# Patient Record
Sex: Male | Born: 1975 | Race: Black or African American | Hispanic: No | Smoking: Current every day smoker
Health system: Southern US, Community
[De-identification: ages and names within clinical notes are randomized; demographics above are authoritative.]

## PROBLEM LIST (undated history)

## (undated) DIAGNOSIS — Z789 Other specified health status: Secondary | ICD-10-CM

## (undated) HISTORY — PX: EYE SURGERY: SHX253

---

## 2007-11-25 ENCOUNTER — Ambulatory Visit (HOSPITAL_COMMUNITY): Admission: EM | Admit: 2007-11-25 | Discharge: 2007-11-26 | Payer: Self-pay | Admitting: Emergency Medicine

## 2009-02-01 IMAGING — CT CT ORBIT/TEMPORAL/IAC W/O CM
3 series · 16 of 47 positions shown, 19 images · IV contrast (agent unspecified)
Comparison: None.

CLINICAL DATA: 31-year-old male with trauma to the right eye and swelling.  Possible glass in the eye.  
 CT OF THE ORBITS WITHOUT CONTRAST:
TECHNIQUE: Axial and coronal plane CT imaging was performed through the orbits.  No intravenous contrast was administered.

[Series 4: orbit/facial 2.0 h30s st · axial · 0.33mm/px · z∈[-132,-46]mm · 10 of 51 slices shown, 13 images]
[im 4/51  brain]
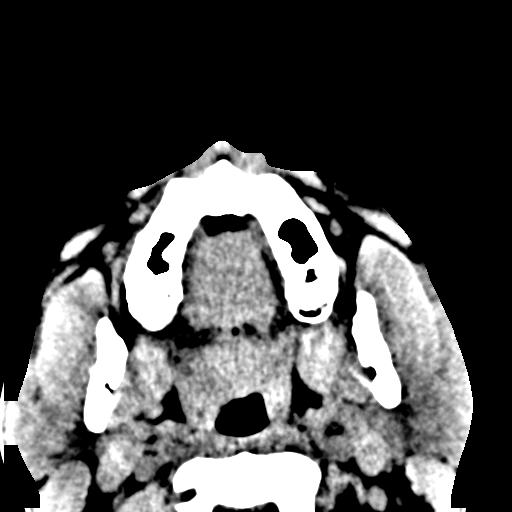
[im 4/51  bone]
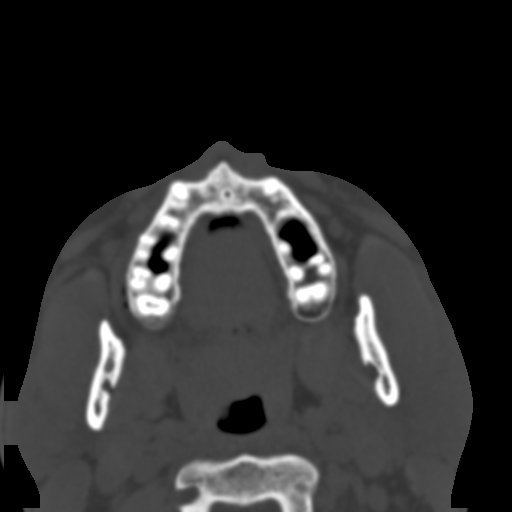
[im 9/51  bone]
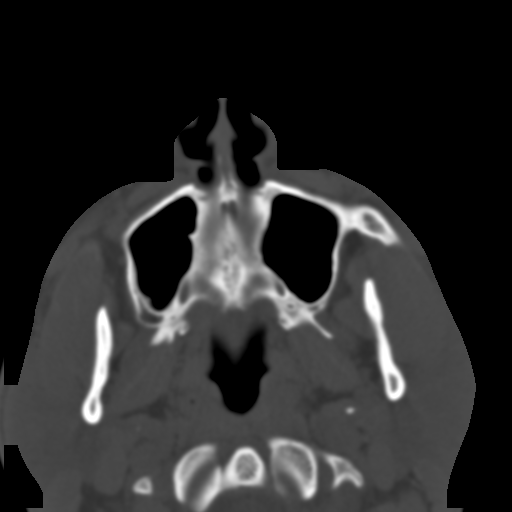
[im 14/51  bone]
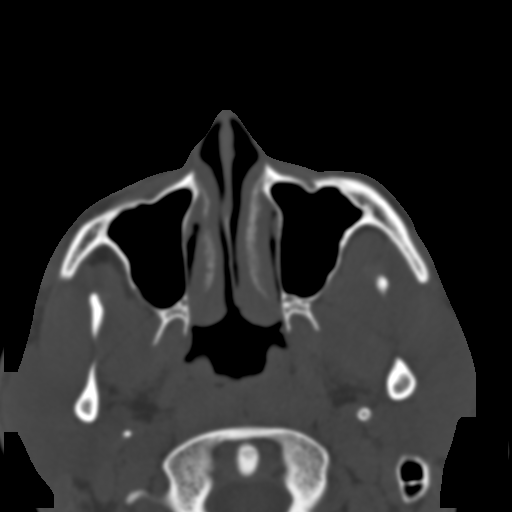
[im 18/51  bone]
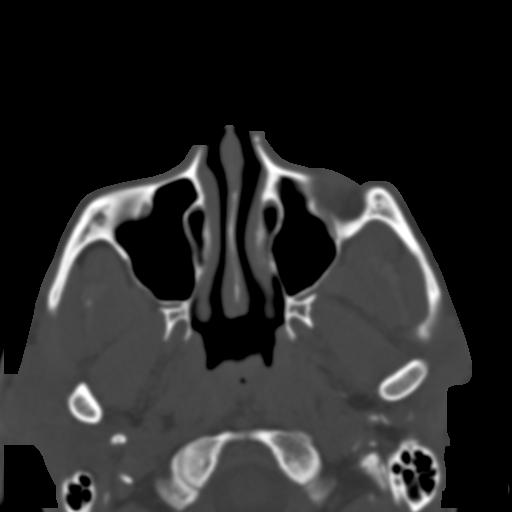
[im 23/51  brain]
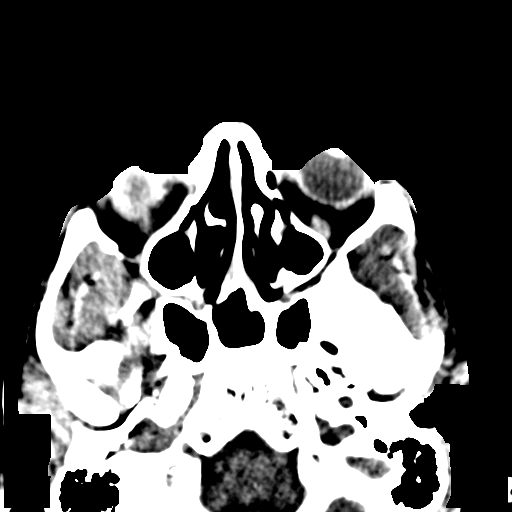
[im 23/51  bone]
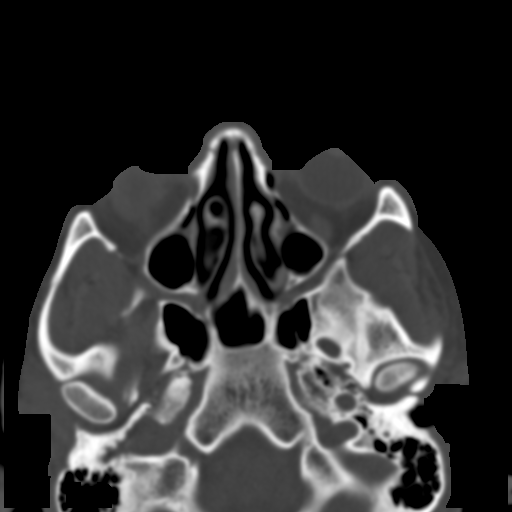
[im 28/51  bone]
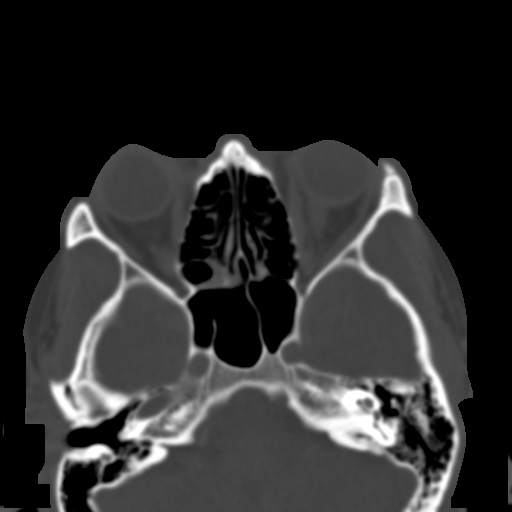
[im 33/51  bone]
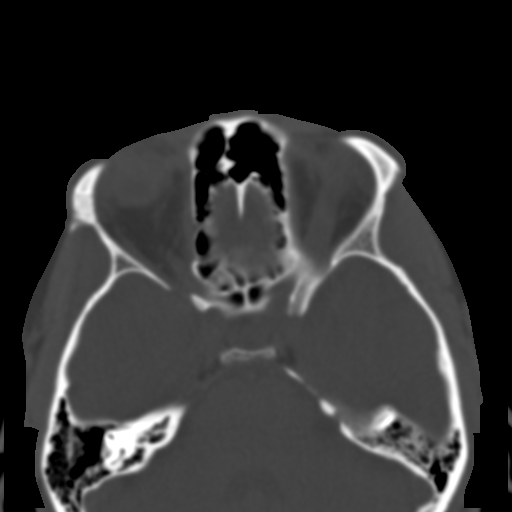
[im 38/51  bone]
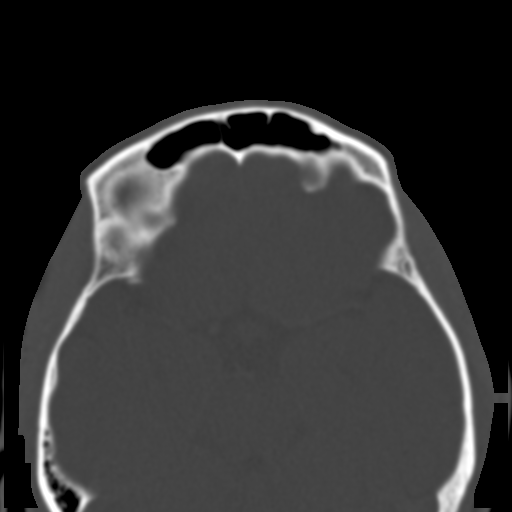
[im 42/51  brain]
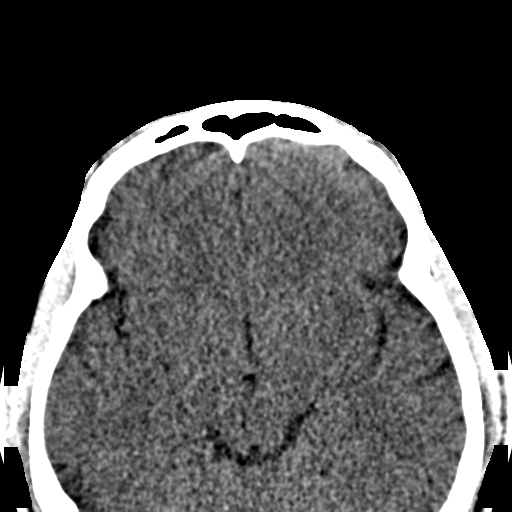
[im 42/51  bone]
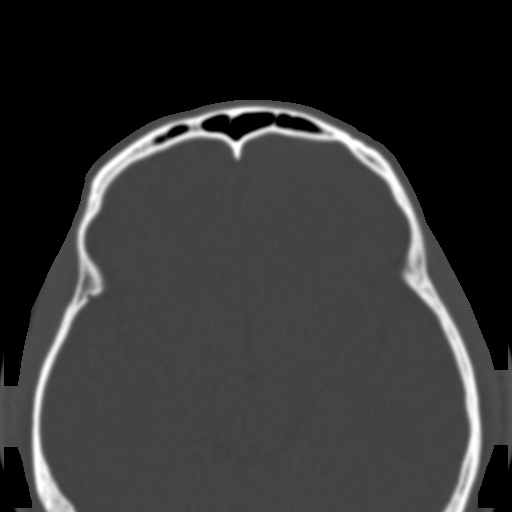
[im 47/51  bone]
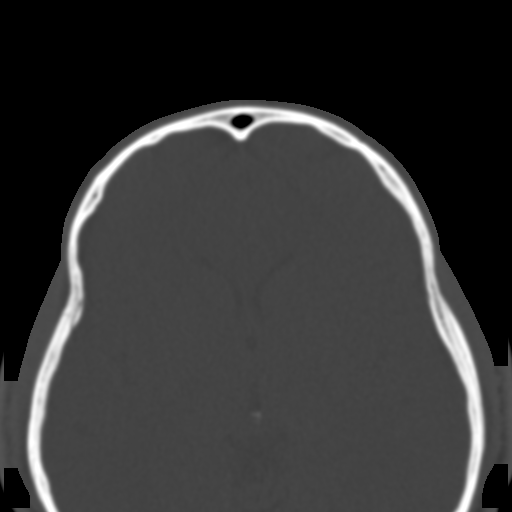

[Series 604: cor · coronal · 0.33mm/px · 3 of 51 slices shown]
[im 17/51  bone]
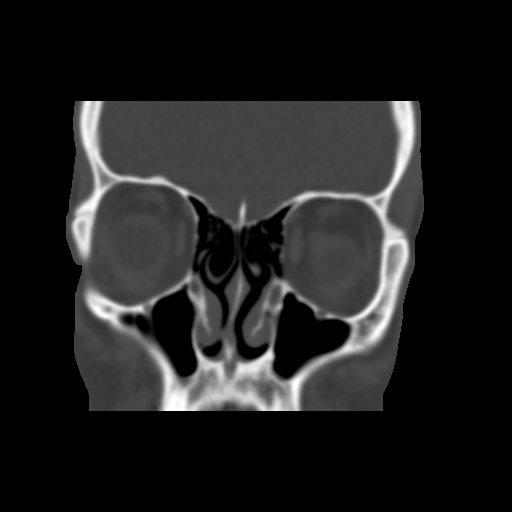
[im 23/51  bone]
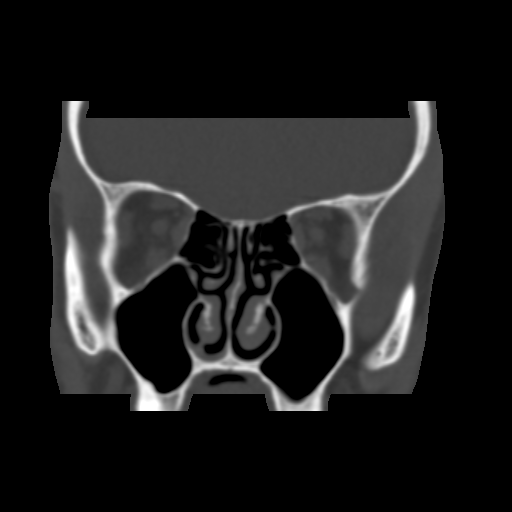
[im 28/51  bone]
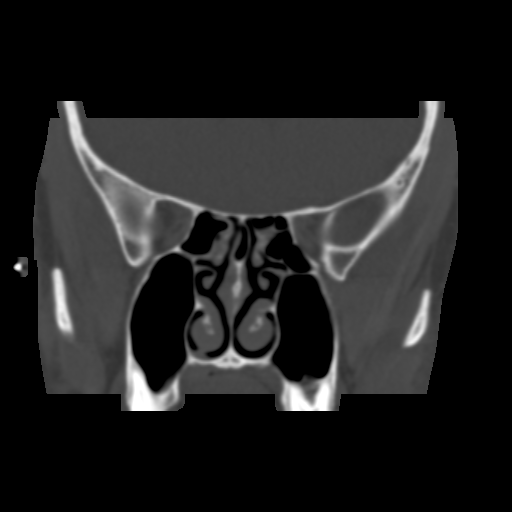

[Series 605: sag · sagittal · 0.33mm/px · 3 of 74 slices shown]
[im 25/74  bone]
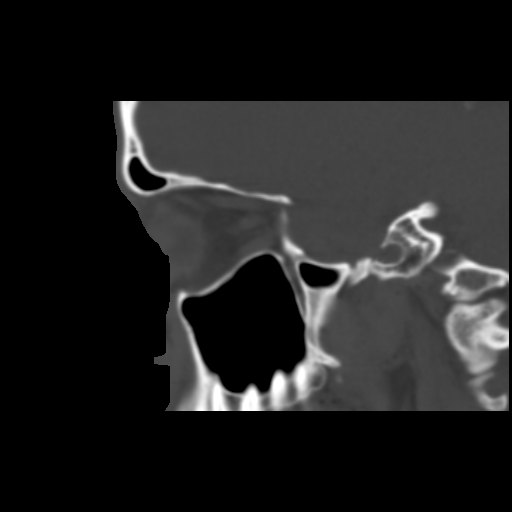
[im 37/74  bone]
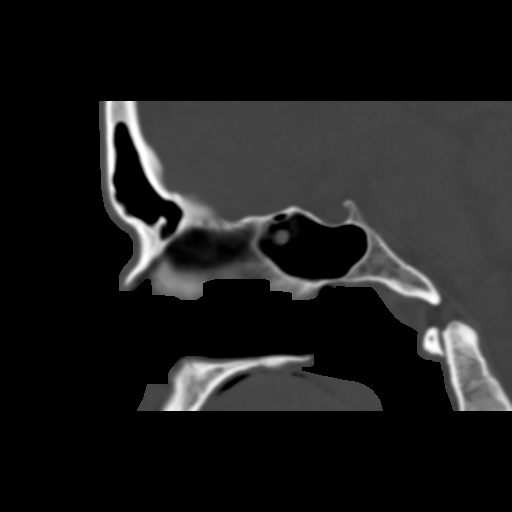
[im 49/74  bone]
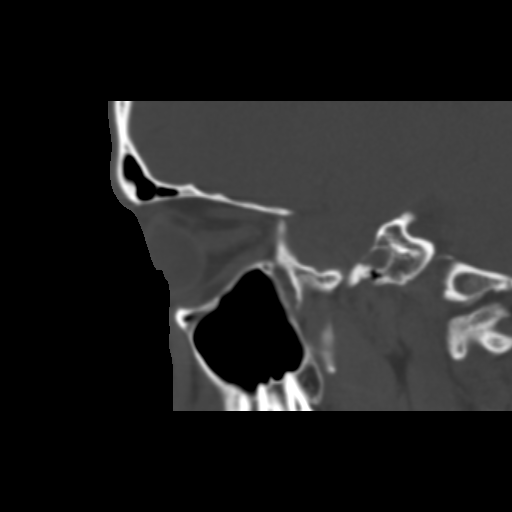

[16 of 47 positions shown; findings below may reference images not displayed]

FINDINGS: There is asymmetric right periorbital soft tissue swelling.  The right globe appears intact.  The right lens is within normal limits.  There is no retrobulbar inflammatory change.  No intraorbital radiopaque foreign body is identified.  Paranasal sinuses are clear aside from mild posterior ethmoid air cell mucosal thickening.  Lamina papyracea are intact. Visualized mastoid air cells are clear.  Bone mineralization is normal.  No nasal bone fracture identified.  No acute orbital fracture is seen.  Visualized brain parenchyma is within normal limits.  The left orbit is normal.
IMPRESSION: 1.  Right periorbital and mild intraorbital soft tissue swelling without retrobulbar inflammation and no radiopaque foreign body identified in the orbit. 
 2.  No acute fracture seen.

## 2011-03-01 NOTE — Op Note (Signed)
[name], [name] NO.:  [id number]   MEDICAL RECORD NO.:  [id number]          PATIENT TYPE:  INP   LOCATION:  2550                         FACILITY:  [facility]   PHYSICIAN:  Michel Harrow, MD DATE OF BIRTH:  1976/09/14   DATE OF PROCEDURE:  DATE OF DISCHARGE:  11/26/2007                               OPERATIVE REPORT   PREOPERATIVE DIAGNOSIS:  Open globe right eye.Marland Kitchen   POSTOPERATIVE DIAGNOSIS:  Open globe right eye.   PROCEDURE:  1. Excision of prolapsed iris tissue right eye.  2. Closure of corneal laceration right eye.  3. Closure of scleral laceration right eye.   SURGEON:  Michel Harrow, MD   SCRUB NURSE ASSISTING:  May Green   ANESTHESIA:  General.   ESTIMATED BLOOD LOSS:  0.   COMPLICATIONS:  None.   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   DESCRIPTION OF PROCEDURE:  The patient was evaluated in the emergency  room.  He was noted to have an open globe requiring surgical repair.  Informed consent was discussed with the patient.  Specifically, the  risks of severe vision loss and infection were discussed.  The patient  was aware that his prognosis for visual recovery is low.  The patient  received 1 g of cefazolin IV and 200 mg of ciprofloxacin IV prior to the  procedure.   The patient was transferred to the operating room.  His eye was rinsed  out with 5% povidone iodine.  Care was taken during the prep not to put  any pressure on the ruptured globe.  The eye was prepped and draped in  the usual sterile opthalmic fashion.  An operating microscope was swung  into position.   The eye was examined under the operating microscope.  A large amount of  iris prolapse was noted through the cornea.  This prolapsed iris was  excised with micro Wescott scissors and 0.124 forceps.  After the iris  was removed, the corneal laceration was seen to extend from the mid  cornea to the inferior sclera.  The  inferior conjunctiva overlying the  sclera was peeled back via peritomy to reveal the underlying scleral  laceration.  A 1 mm blade was used to create a paracentesis.  Vicoat was  injected through the paracentesis to reform the anterior chamber.  A  corneal laceration was closed with 8 interrupted sutures of 10-0 nylon.  The limbal wound was closed with four interrupted sutures of 9-0 nylon.  The scleral laceration was closed with three interrupted sutures of 8-0  nylon.  The knots of the sutures in the cornea and the limbus were  buried.  The conjunctiva was closed over the scleral laceration with two  interrupted Vicryl sutures.  A fluorescein strip was used to check the  wounds for water tightness.  The eye was not leaking.  The anterior  chamber was formed.   Additional findings during the surgery include a traumatic cataract, an  inferior hyphema, and an abnormal pupillary sphincter.   Maxitrol ophthalmic ointment was placed into  the patient's right eye.  This was followed by sterile eye patch and a Fox shield.   The patient was transferred to the recovery room.  In the recovery room,  he received a tetanus prophylaxis shot.  He was instructed to keep the  sterile patch and shield on his eye until he follows up with Dr. Charlotte Sanes  today at 11:00 a.m.  he was given a map to Dr. Marnee Spring office.  He was  given Dr. Marnee Spring phone number.  It was stressed with the patient the  importance of follow up.  He understands that failure to do so will  result in vision loss.  He understands that he will need subsequent  surgeries for possible retinal repair and cataract extraction.  It was  explained to him that at Dr. Marnee Spring office, he would receive a  prescription for ciprofloxacin p.o. and for opthalmic eye drops.      Michel Harrow, MD  Electronically Signed     CLM/MEDQ  D:  11/26/2007  T:  11/26/2007  Job:  [id number]

## 2014-04-24 ENCOUNTER — Encounter (HOSPITAL_COMMUNITY): Payer: Self-pay

## 2014-04-24 ENCOUNTER — Emergency Department (HOSPITAL_COMMUNITY)
Admission: EM | Admit: 2014-04-24 | Discharge: 2014-04-24 | Disposition: A | Discharge status: Home / Self Care | Payer: Self-pay | Attending: Emergency Medicine | Admitting: Emergency Medicine

## 2014-04-24 ENCOUNTER — Encounter (HOSPITAL_COMMUNITY)
Admission: RE | Disposition: A | Discharge status: Home / Self Care | Payer: Self-pay | Source: Ambulatory Visit | Attending: Orthopedic Surgery

## 2014-04-24 ENCOUNTER — Encounter (HOSPITAL_COMMUNITY)
Admission: EM | Disposition: A | Discharge status: Home / Self Care | Payer: Self-pay | Source: Home / Self Care | Attending: Emergency Medicine

## 2014-04-24 ENCOUNTER — Encounter (HOSPITAL_COMMUNITY): Payer: Self-pay | Admitting: Anesthesiology

## 2014-04-24 ENCOUNTER — Emergency Department (HOSPITAL_COMMUNITY): Payer: Self-pay

## 2014-04-24 ENCOUNTER — Ambulatory Visit (HOSPITAL_COMMUNITY): Payer: Self-pay | Admitting: Anesthesiology

## 2014-04-24 ENCOUNTER — Ambulatory Visit (HOSPITAL_COMMUNITY)
Admission: RE | Admit: 2014-04-24 | Discharge: 2014-04-24 | Disposition: A | Discharge status: Home / Self Care | Payer: Self-pay | Source: Ambulatory Visit | Attending: Orthopedic Surgery | Admitting: Orthopedic Surgery

## 2014-04-24 ENCOUNTER — Encounter (HOSPITAL_COMMUNITY): Payer: Self-pay | Admitting: Emergency Medicine

## 2014-04-24 DIAGNOSIS — F17200 Nicotine dependence, unspecified, uncomplicated: Secondary | ICD-10-CM | POA: Insufficient documentation

## 2014-04-24 DIAGNOSIS — S63289A Dislocation of proximal interphalangeal joint of unspecified finger, initial encounter: Secondary | ICD-10-CM

## 2014-04-24 DIAGNOSIS — X58XXXA Exposure to other specified factors, initial encounter: Secondary | ICD-10-CM | POA: Insufficient documentation

## 2014-04-24 DIAGNOSIS — W2203XA Walked into furniture, initial encounter: Secondary | ICD-10-CM | POA: Insufficient documentation

## 2014-04-24 DIAGNOSIS — Y929 Unspecified place or not applicable: Secondary | ICD-10-CM | POA: Insufficient documentation

## 2014-04-24 DIAGNOSIS — S63279A Dislocation of unspecified interphalangeal joint of unspecified finger, initial encounter: Secondary | ICD-10-CM | POA: Insufficient documentation

## 2014-04-24 DIAGNOSIS — Y9383 Activity, rough housing and horseplay: Secondary | ICD-10-CM | POA: Insufficient documentation

## 2014-04-24 HISTORY — PX: CLOSED REDUCTION METACARPAL WITH PERCUTANEOUS PINNING: SHX5613

## 2014-04-24 HISTORY — DX: Other specified health status: Z78.9

## 2014-04-24 SURGERY — CLOSED REDUCTION, FRACTURE, METACARPAL BONE, WITH PERCUTANEOUS PINNING
Anesthesia: General | Site: Finger | Laterality: Right

## 2014-04-24 SURGERY — CLOSED REDUCTION, FINGER, WITH PERCUTANEOUS PINNING
Anesthesia: General | Laterality: Right

## 2014-04-24 MED ORDER — OXYCODONE HCL 5 MG PO TABS
ORAL_TABLET | ORAL | Status: AC
  Filled 2014-04-24: qty 1

## 2014-04-24 MED ORDER — MEPERIDINE HCL 25 MG/ML IJ SOLN
6.2500 mg | INTRAMUSCULAR | Status: DC | PRN
Start: 2014-04-24 — End: 2014-04-25

## 2014-04-24 MED ORDER — LACTATED RINGERS IV SOLN
INTRAVENOUS | Status: DC | PRN
  Administered 2014-04-24: 20:00:00 via INTRAVENOUS

## 2014-04-24 MED ORDER — LIDOCAINE HCL (CARDIAC) 20 MG/ML IV SOLN
INTRAVENOUS | Status: DC | PRN
  Administered 2014-04-24: 100 mg via INTRAVENOUS

## 2014-04-24 MED ORDER — LIDOCAINE HCL (CARDIAC) 20 MG/ML IV SOLN
INTRAVENOUS | Status: AC
  Filled 2014-04-24: qty 5

## 2014-04-24 MED ORDER — OXYCODONE HCL 5 MG/5ML PO SOLN: 5.0000 mg | Freq: Once | ORAL | Status: AC | PRN

## 2014-04-24 MED ORDER — OXYCODONE HCL 5 MG PO TABS
5.0000 mg | ORAL_TABLET | Freq: Once | ORAL | Status: AC | PRN
  Administered 2014-04-24: 5 mg via ORAL

## 2014-04-24 MED ORDER — PROPOFOL 10 MG/ML IV BOLUS
INTRAVENOUS | Status: AC
  Filled 2014-04-24: qty 20

## 2014-04-24 MED ORDER — SUCCINYLCHOLINE CHLORIDE 20 MG/ML IJ SOLN
INTRAMUSCULAR | Status: DC | PRN
  Administered 2014-04-24: 140 mg via INTRAVENOUS

## 2014-04-24 MED ORDER — PROMETHAZINE HCL 25 MG/ML IJ SOLN: 6.2500 mg | INTRAMUSCULAR | Status: DC | PRN

## 2014-04-24 MED ORDER — HYDROMORPHONE HCL PF 1 MG/ML IJ SOLN
0.2500 mg | INTRAMUSCULAR | Status: DC | PRN
  Administered 2014-04-24 (×2): 0.5 mg via INTRAVENOUS

## 2014-04-24 MED ORDER — PROPOFOL 10 MG/ML IV BOLUS
INTRAVENOUS | Status: DC | PRN
  Administered 2014-04-24: 200 mg via INTRAVENOUS

## 2014-04-24 MED ORDER — LACTATED RINGERS IV SOLN
INTRAVENOUS | Status: DC
  Administered 2014-04-24: 19:00:00 via INTRAVENOUS

## 2014-04-24 MED ORDER — CEFAZOLIN SODIUM-DEXTROSE 2-3 GM-% IV SOLR: 2.0000 g | INTRAVENOUS | Status: DC

## 2014-04-24 MED ORDER — CHLORHEXIDINE GLUCONATE 4 % EX LIQD
60.0000 mL | Freq: Once | CUTANEOUS | Status: DC
  Filled 2014-04-24: qty 60

## 2014-04-24 MED ORDER — OXYCODONE-ACETAMINOPHEN 5-325 MG PO TABS: 1.0000 | ORAL_TABLET | Freq: Four times a day (QID) | ORAL | Status: AC | PRN

## 2014-04-24 MED ORDER — SUCCINYLCHOLINE CHLORIDE 20 MG/ML IJ SOLN
INTRAMUSCULAR | Status: AC
  Filled 2014-04-24: qty 1

## 2014-04-24 MED ORDER — SUFENTANIL CITRATE 50 MCG/ML IV SOLN
INTRAVENOUS | Status: AC
  Filled 2014-04-24: qty 1

## 2014-04-24 MED ORDER — SODIUM CHLORIDE 0.9 % IJ SOLN
INTRAMUSCULAR | Status: AC
  Filled 2014-04-24: qty 10

## 2014-04-24 MED ORDER — MIDAZOLAM HCL 2 MG/2ML IJ SOLN
INTRAMUSCULAR | Status: AC
  Filled 2014-04-24: qty 2

## 2014-04-24 MED ORDER — HYDROMORPHONE HCL PF 1 MG/ML IJ SOLN
INTRAMUSCULAR | Status: AC
  Filled 2014-04-24: qty 1

## 2014-04-24 MED ORDER — OXYCODONE-ACETAMINOPHEN 5-325 MG PO TABS: 1.0000 | ORAL_TABLET | Freq: Four times a day (QID) | ORAL | Status: DC | PRN

## 2014-04-24 MED ORDER — MIDAZOLAM HCL 2 MG/2ML IJ SOLN: 0.5000 mg | Freq: Once | INTRAMUSCULAR | Status: DC | PRN

## 2014-04-24 MED ORDER — OXYCODONE-ACETAMINOPHEN 5-325 MG PO TABS
1.0000 | ORAL_TABLET | Freq: Once | ORAL | Status: AC
  Administered 2014-04-24: 1 via ORAL
  Filled 2014-04-24: qty 1

## 2014-04-24 SURGICAL SUPPLY — 2 items
BNDG COHESIVE 1X5 TAN STRL LF (GAUZE/BANDAGES/DRESSINGS) ×3 IMPLANT
SPLINT FINGER (SOFTGOODS) ×3 IMPLANT

## 2014-04-24 NOTE — Transfer of Care (Signed)
Immediate Anesthesia Transfer of Care Note  Patient: [name]  Procedure(s) Performed: Procedure(s): CLOSED REDUCTION RIGHT RING METACARPAL (Right)  Patient Location: PACU  Anesthesia Type:General  Level of Consciousness: awake, alert , oriented and patient cooperative  Airway & Oxygen Therapy: Patient Spontanous Breathing and Patient connected to nasal cannula oxygen  Post-op Assessment: Report given to PACU RN, Post -op Vital signs reviewed and stable and Patient moving all extremities X 4  Post vital signs: Reviewed and stable  Complications: No apparent anesthesia complications

## 2014-04-24 NOTE — ED Notes (Signed)
Pt has deformity to rt ring finger that occurred after horse playing. Pt rates pain 10/10 has limited movement in finger.

## 2014-04-24 NOTE — Brief Op Note (Signed)
04/24/2014  4:15 PM  PATIENT:  [name]  38 y.o. male  PRE-OPERATIVE DIAGNOSIS:  Right Ring Finger  POST-OPERATIVE DIAGNOSIS: right ring finger closed ip dislocation  PROCEDURE:  Procedure(s): CLOSED REDUCTION METACARPAL WITH PERCUTANEOUS PINNING (Right)  SURGEON:  Surgeon(s) and Role:    * Sharma CovertFred W Guilford Shannahan, MD - Primary  PHYSICIAN ASSISTANT:   ASSISTANTS: none   ANESTHESIA:   general  EBL:     BLOOD ADMINISTERED:none  DRAINS: none   LOCAL MEDICATIONS USED:none  SPECIMEN:  No Specimen  DISPOSITION OF SPECIMEN:  N/A  COUNTS:  YES  TOURNIQUET:  * No tourniquets in log *  DICTATIO: [id number]: [id number]  PLAN OF CARE: Discharge to home after PACU  PATIENT DISPOSITION:  PACU - hemodynamically stable.   Delay start of Pharmacological VTE agent (>24hrs) due to surgical blood loss or risk of bleeding: not applicable

## 2014-04-24 NOTE — Anesthesia Procedure Notes (Signed)
Procedure Name: Intubation Date/Time: 04/24/2014 8:10 PM Performed by: Claris Che Pre-anesthesia Checklist: Patient identified, Emergency Drugs available, Suction available and Patient being monitored Patient Re-evaluated:Patient Re-evaluated prior to inductionOxygen Delivery Method: Circle system utilized Preoxygenation: Pre-oxygenation with 100% oxygen Intubation Type: Rapid sequence, IV induction and Cricoid Pressure applied Ventilation: Mask ventilation without difficulty Laryngoscope Size: 4 Grade View: Grade I Tube type: Oral Tube size: 8.0 mm Number of attempts: 1 Airway Equipment and Method: LTA kit utilized and Stylet Placement Confirmation: ETT inserted through vocal cords under direct vision,  positive ETCO2 and breath sounds checked- equal and bilateral Secured at: 24 cm Tube secured with: Tape Dental Injury: Teeth and Oropharynx as per pre-operative assessment

## 2014-04-24 NOTE — Discharge Instructions (Signed)
KEEP BANDAGE CLEAN AND DRY °CALL OFFICE FOR F/U APPT [phone] IN 10-12 DAYS °KEEP HAND ELEVATED ABOVE HEART °OK TO APPLY ICE TO OPERATIVE AREA °CONTACT OFFICE IF ANY WORSENING PAIN OR CONCERNS. °

## 2014-04-24 NOTE — ED Notes (Signed)
Went to room; pt not in room per staff sts was going to "get his phone"; unsure if pt will return

## 2014-04-24 NOTE — Discharge Instructions (Signed)
Please do not eat anything after 8 a.m. this morning. Return to the hospital at 4 PM this evening for surgery. He'll likely need to go to the short stay area. Your surgeon is Dr. Melvyn Novasrtmann.

## 2014-04-24 NOTE — ED Notes (Signed)
Patient here with injury to right ring finger. States he was involved in some "horse play", hit finger on door. Finger appears displaced.

## 2014-04-24 NOTE — ED Provider Notes (Signed)
CSN: [id number]     Arrival date & time 04/24/14  [id number] History   First MD Initiated Contact with Patient 04/24/14 [phone]     Chief Complaint  Patient presents with  . Finger Injury     (Consider location/radiation/quality/duration/timing/severity/associated sxs/prior Treatment) Patient is a 38 y.o. male presenting with hand injury. The history is provided by the patient.  Hand Injury Location:  Finger Finger location:  R ring finger Pain details:    Quality:  Aching   Radiates to:  Does not radiate   Severity:  Moderate   Onset quality:  Sudden   Duration:  1 hour   Timing:  Constant   Progression:  Unchanged Chronicity:  New Handedness:  Right-handed Dislocation: no   Foreign body present:  No foreign bodies Prior injury to area:  No Relieved by:  Nothing Worsened by:  Nothing tried Ineffective treatments:  None tried Associated symptoms: no fever and no neck pain     No past medical history on file. No past surgical history on file. No family history on file. History  Substance Use Topics  . Smoking status: Current Every Day Smoker -- 0.50 packs/day    Types: Cigarettes  . Smokeless tobacco: Not on file  . Alcohol Use: No    Review of Systems  Constitutional: Negative for fever.  HENT: Negative for drooling and rhinorrhea.   Eyes: Negative for pain.  Respiratory: Negative for cough and shortness of breath.   Cardiovascular: Negative for chest pain and leg swelling.  Gastrointestinal: Negative for nausea, vomiting, abdominal pain and diarrhea.  Genitourinary: Negative for dysuria and hematuria.  Musculoskeletal: Negative for gait problem and neck pain.  Skin: Negative for color change.  Neurological: Negative for numbness and headaches.  Hematological: Negative for adenopathy.  Psychiatric/Behavioral: Negative for behavioral problems.  All other systems reviewed and are negative.     Allergies  Review of patient's allergies indicates no known  allergies.  Home Medications   Prior to Admission medications   Not on File   BP 149/78  Pulse 96  Temp(Src) 98.2 F (36.8 C) (Oral)  Resp 18  Ht 5\' 10"  (1.778 m)  Wt 225 lb (102.059 kg)  BMI 32.28 kg/m2  SpO2 97% Physical Exam  Nursing note and vitals reviewed. Constitutional: He is oriented to person, place, and time. He appears well-developed and well-nourished.  HENT:  Head: Normocephalic and atraumatic.  Right Ear: External ear normal.  Left Ear: External ear normal.  Nose: Nose normal.  Mouth/Throat: Oropharynx is clear and moist. No oropharyngeal exudate.  Eyes: Conjunctivae and EOM are normal. Pupils are equal, round, and reactive to light.  Neck: Normal range of motion. Neck supple.  Cardiovascular: Normal rate, regular rhythm, normal heart sounds and intact distal pulses.  Exam reveals no gallop and no friction rub.   No murmur heard. Pulmonary/Chest: Effort normal and breath sounds normal. No respiratory distress. He has no wheezes.  Abdominal: Soft. Bowel sounds are normal. He exhibits no distension. There is no tenderness. There is no rebound and no guarding.  Musculoskeletal: Normal range of motion. He exhibits no edema and no tenderness.  Dorsal and ulnar dislocation of the right ring finger at the PIP joint.  Sensation and motor skills of the right hand otherwise intact.  2+ pulses in the bilateral upper extremities.  Neurological: He is alert and oriented to person, place, and time.  Skin: Skin is warm and dry.  Psychiatric: He has a normal mood and affect. His behavior  is normal.    ED Course  Reduction of dislocation Date/Time: 04/24/2014 7:48 PM Performed by: Purvis Sheffield, S Authorized by: Purvis Sheffield, S Consent: Verbal consent obtained. written consent not obtained. Risks and benefits: risks, benefits and alternatives were discussed Consent given by: patient Patient understanding: patient states understanding of the procedure being  performed Required items: required blood products, implants, devices, and special equipment available Patient identity confirmed: verbally with patient, arm band, provided demographic data and hospital-assigned identification number Time out: Immediately prior to procedure a "time out" was called to verify the correct patient, procedure, equipment, support staff and site/side marked as required. Preparation: Patient was prepped and draped in the usual sterile fashion. Local anesthesia used: yes Anesthesia: digital block Local anesthetic: lidocaine 2% without epinephrine Patient sedated: no Patient tolerance: Patient tolerated the procedure well with no immediate complications.   (including critical care time) Labs Review Labs Reviewed - No data to display  Imaging Review Dg Finger Ring Right  04/24/2014   CLINICAL DATA:  Injury to the ring finger with deformity.  EXAM: RIGHT RING FINGER 2+V  COMPARISON:  None.  FINDINGS: There is dorsal and ulnar dislocation and overriding of the middle phalanx with respect the proximal phalanx of the right fourth finger. No fractures are appreciated. Soft tissue swelling.  IMPRESSION: Dislocation of the proximal interphalangeal joint of the right fourth finger.   Electronically Signed   By: Burman Nieves M.D.   On: 04/24/2014 04:17     EKG Interpretation None      MDM   Final diagnoses:  Dislocation of PIP joint of finger, initial encounter    6:46 AM 38 y.o. male who presents with right Ring finger dislocation which occurred prior to arrival when he was trying to open a door. He is otherwise neurovascularly intact. Will place a digital block and attempt reduction.  I was unsuccessful in reducing the dislocation. I discussed the case with Dr. Melvyn Novas who will take the patient to the OR later this evening.  7:49 PM: Will place in finger splint. Pt to be npo at 8AM, he voices understanding of this.  I have discussed the diagnosis/risks/treatment  options with the patient and believe the pt to be eligible for discharge home to follow-up at 4pm at [facility] this evening for surgery. We also discussed returning to the ED immediately if new or worsening sx occur. We discussed the sx which are most concerning (e.g., worsening pain) that necessitate immediate return. Medications administered to the patient during their visit and any new prescriptions provided to the patient are listed below.  Medications given during this visit Medications  oxyCODONE-acetaminophen (PERCOCET/ROXICET) 5-325 MG per tablet 1 tablet (1 tablet Oral Given 04/24/14 0344)    Discharge Medication List as of 04/24/2014  7:34 AM    START taking these medications   Details  oxyCODONE-acetaminophen (PERCOCET) 5-325 MG per tablet Take 1 tablet by mouth every 6 (six) hours as needed., Starting 04/24/2014, Until Discontinued, Print           Junius Argyle, MD 04/24/14 [phone]

## 2014-04-24 NOTE — Progress Notes (Signed)
Orthopedic Tech Progress Note Patient Details:  [name] 1975-12-03 [id number] Finger splint applied Ortho Devices Type of Ortho Device: Finger splint Ortho Device/Splint Location: R r4th digit Ortho Device/Splint Interventions: Application   Asia R Thompson 04/24/2014, 7:45 AM

## 2014-04-24 NOTE — Anesthesia Preprocedure Evaluation (Addendum)
Anesthesia Evaluation  Patient identified by MRN, date of birth, ID band Patient awake    Reviewed: Allergy & Precautions, H&P , NPO status , Patient's Chart, lab work & pertinent test results  History of Anesthesia Complications Negative for: history of anesthetic complications  Airway Mallampati: II TM Distance: >3 FB     Dental  (+) Dental Advisory Given   Pulmonary Current Smoker,  breath sounds clear to auscultation        Cardiovascular negative cardio ROS  Rhythm:Regular Rate:Normal     Neuro/Psych negative neurological ROS     GI/Hepatic negative GI ROS, Neg liver ROS,   Endo/Other  Morbid obesity  Renal/GU negative Renal ROS     Musculoskeletal   Abdominal (+) + obese,   Peds  Hematology negative hematology ROS (+)   Anesthesia Other Findings   Reproductive/Obstetrics                          Anesthesia Physical Anesthesia Plan  ASA: II  Anesthesia Plan: General   Post-op Pain Management:    Induction: Intravenous and Rapid sequence  Airway Management Planned: Oral ETT  Additional Equipment:   Intra-op Plan:   Post-operative Plan: Extubation in OR  Informed Consent: I have reviewed the patients History and Physical, chart, labs and discussed the procedure including the risks, benefits and alternatives for the proposed anesthesia with the patient or authorized representative who has indicated his/her understanding and acceptance.   Dental advisory given  Plan Discussed with: Surgeon and CRNA  Anesthesia Plan Comments: (Plan routine monitors, GETA)        Anesthesia Quick Evaluation

## 2014-04-24 NOTE — Anesthesia Postprocedure Evaluation (Signed)
  Anesthesia Post-op Note  Patient: [name]  Procedure(s) Performed: Procedure(s): CLOSED REDUCTION RIGHT RING METACARPAL (Right)  Patient Location: PACU  Anesthesia Type:General  Level of Consciousness: awake, alert , oriented and patient cooperative  Airway and Oxygen Therapy: Patient Spontanous Breathing  Post-op Pain: none  Post-op Assessment: Post-op Vital signs reviewed, Patient's Cardiovascular Status Stable, Respiratory Function Stable, Patent Airway, No signs of Nausea or vomiting and Pain level controlled  Post-op Vital Signs: Reviewed and stable  Last Vitals:  Filed Vitals:   04/24/14 2100  BP: 125/80  Pulse: 62  Temp:   Resp: 14    Complications: No apparent anesthesia complications

## 2014-04-24 NOTE — H&P (Signed)
[name] is an 38 y.o. male.   Chief Complaint: RIGHT RING FINGER PIP DISLOCATION HPI: PT SUSTAINED CLOSED INJURY TO RIGHT RING FINGER PT PRESENTED TO ED WITH DEFORMITY TO RIGHT RING FINGER SEVERAL ATTEMPTS MADE AT REDUCTION AND UNSUCCESSFUL PT HERE FOR SURGERY ON RIGHT RING FINGER TO REDUCE JOINT  No past medical history on file.  No past surgical history on file.  No family history on file. Social History:  reports that he has been smoking Cigarettes.  He has been smoking about 0.50 packs per day. He does not have any smokeless tobacco history on file. He reports that he does not drink alcohol or use illicit drugs.  Allergies: No Known Allergies  No prescriptions prior to admission    No results found for this or any previous visit (from the past 48 hour(s)). Dg Finger Ring Right  04/24/2014   CLINICAL DATA:  Injury to the ring finger with deformity.  EXAM: RIGHT RING FINGER 2+V  COMPARISON:  None.  FINDINGS: There is dorsal and ulnar dislocation and overriding of the middle phalanx with respect the proximal phalanx of the right fourth finger. No fractures are appreciated. Soft tissue swelling.  IMPRESSION: Dislocation of the proximal interphalangeal joint of the right fourth finger.   Electronically Signed   By: Burman NievesWilliam  Stevens M.D.   On: 04/24/2014 04:17    ROS NO RECENT ILLNESSES OR HOSPITALIZATIONS  There were no vitals taken for this visit. Physical Exam   General Appearance:  Alert, cooperative, no distress, appears stated age  Head:  Normocephalic, without obvious abnormality, atraumatic  Eyes:  Pupils equal, conjunctiva/corneas clear,         Throat: Lips, mucosa, and tongue normal; teeth and gums normal  Neck: No visible masses     Lungs:   respirations unlabored  Chest Wall:  No tenderness or deformity  Heart:  Regular rate and rhythm,  Abdomen:   Soft, non-tender,         Extremities: RIGHT RING FINGER, OBVIOUS DEFORMITY NO INJURIES TO INDEX/LONG AND  SMALL GOOD THUMB MOBILITY FINGERS WARM WELL PERFUSED  Pulses: 2+ and symmetric  Skin: Skin color, texture, turgor normal, no rashes or lesions     Neurologic: Normal    Assessment/Plan RIGHT RING FINGER IRREDUCIBLE PIP DISLOCATION  RIGHT RING FINGER CLOSED MANIPULATION VERSUS OPEN REDUCTION AND RECONSTRUCTION  R/B/A DISCUSSED WITH PT IN HOSPITAL.  PT VOICED UNDERSTANDING OF PLAN CONSENT SIGNED DAY OF SURGERY PT SEEN AND EXAMINED PRIOR TO OPERATIVE PROCEDURE/DAY OF SURGERY SITE MARKED. QUESTIONS ANSWERED WILL GO HOME FOLLOWING SURGERY WE ARE PLANNING SURGERY FOR YOUR UPPER EXTREMITY. THE RISKS AND BENEFITS OF SURGERY INCLUDE BUT NOT LIMITED TO BLEEDING INFECTION, DAMAGE TO NEARBY NERVES ARTERIES TENDONS, FAILURE OF SURGERY TO ACCOMPLISH ITS INTENDED GOALS, PERSISTENT SYMPTOMS AND NEED FOR FURTHER SURGICAL INTERVENTION. WITH THIS IN MIND WE WILL PROCEED. I HAVE DISCUSSED WITH THE PATIENT THE PRE AND POSTOPERATIVE REGIMEN AND THE DOS AND DON'TS. PT VOICED UNDERSTANDING AND INFORMED CONSENT SIGNED. Sharma CovertTMANN,[name] 04/24/2014, 1829 PM

## 2014-04-25 ENCOUNTER — Encounter (HOSPITAL_COMMUNITY): Payer: Self-pay | Admitting: Orthopedic Surgery

## 2014-04-25 NOTE — Op Note (Signed)
NAMGary Fleet:  [place], Ruslan                ACCOUNT NO.:  [id number]  MEDICAL RECORD NO.:  [id number]  LOCATION:                                 FACILITY:  PHYSICIAN:  Madelynn DoneFred W Raidyn Wassink IV, MD  DATE OF BIRTH:  July 09, 1976  DATE OF PROCEDURE:  04/24/2014 DATE OF DISCHARGE:  04/24/2014                              OPERATIVE REPORT   PREOPERATIVE DIAGNOSIS:  Right ring finger, irreducible proximal interphalangeal dislocation.  POSTOPERATIVE DIAGNOSIS:  Right ring finger, irreducible proximal interphalangeal dislocation.  ATTENDING PHYSICIAN:  Sharma CovertFred W. Shefali Ng IV, MD, was scrubbed and present for the entire procedure.  ASSISTANT SURGEON:  None.  ANESTHESIA:  General via endotracheal tube.  SURGICAL PROCEDURE: 1. Closed manipulation of right ring finger IP dislocation requiring     anesthesia. 2. Radiographs 2 views, right ring finger. 3. Radiographic interpretation, AP lateral views of the finger did     show the concentric reduction of the dorsal IP dislocation.  SURGICAL INDICATIONS:  Mr. Derrell Lollingngram is a 38 year old gentleman who underwent closed manipulation, unsuccessfully for an IP dislocation in the ER.  Multiple attempts were made and it was recommended that he undergo the above procedure.  Risks, benefits, and alternatives were discussed with the patient and signed informed consent was obtained.  DESCRIPTION OF PROCEDURE:  The patient was properly identified in the preoperative holding area and marked with a permanent marker made on the right ring finger to indicate the correct operative site.  The patient was brought back to the operating room, placed supine on anesthesia room table where the general anesthetic was administered.  The patient tolerated this well.  Following anesthesia, closed manipulation was then performed.  This reduced very nicely.  A dorsal blocking splint was then applied.  Final radiographs were obtained.  The patient was extubated and taken to recovery room in  good condition.  POSTPROCEDURE PLAN:  The patient discharged home, seen back in the office approximately 10-14 days for x-rays and then continue with a dorsal blocking splint for a total of 3 weeks and then gradually use in activity.  Radiographs at each visit.     Madelynn DoneFred W Zaynab Chipman IV, MD     FWO/MEDQ  D:  04/24/2014  T:  04/25/2014  Job:  [id number]

## 2015-07-02 IMAGING — CR DG FINGER RING 2+V*R*
3 series · 3 of 3 positions shown · non-contrast
Comparison: None.

CLINICAL DATA: Injury to the ring finger with deformity.

EXAM:
RIGHT RING FINGER 2+V

[x finger pa right]
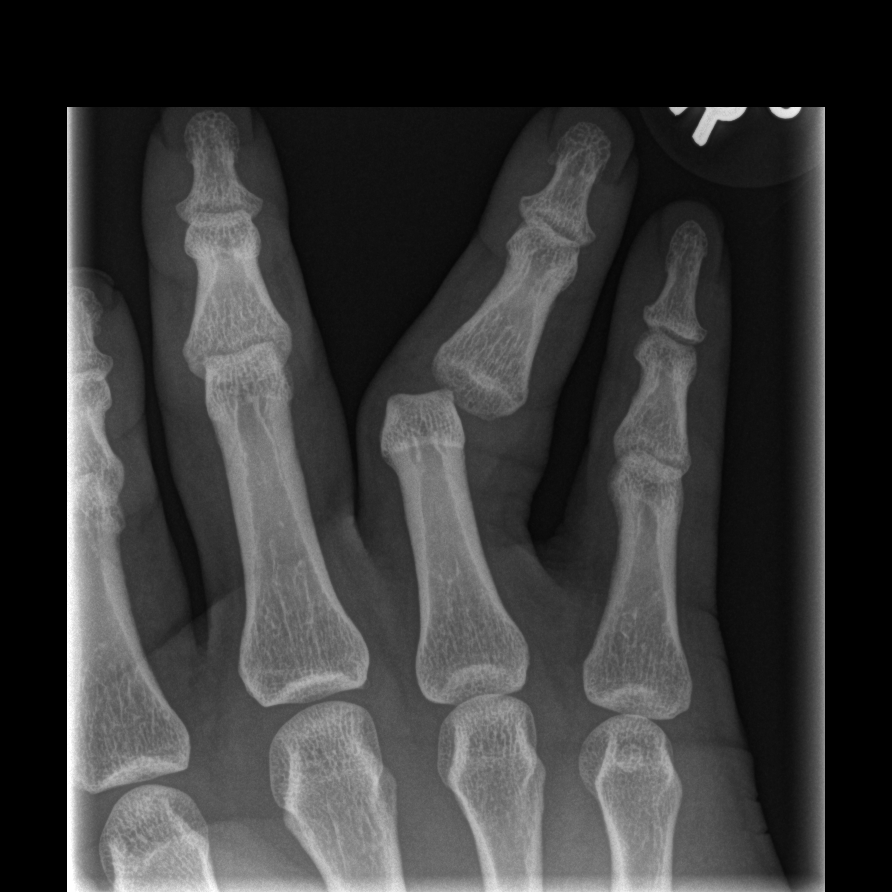

[x finger obl. right]
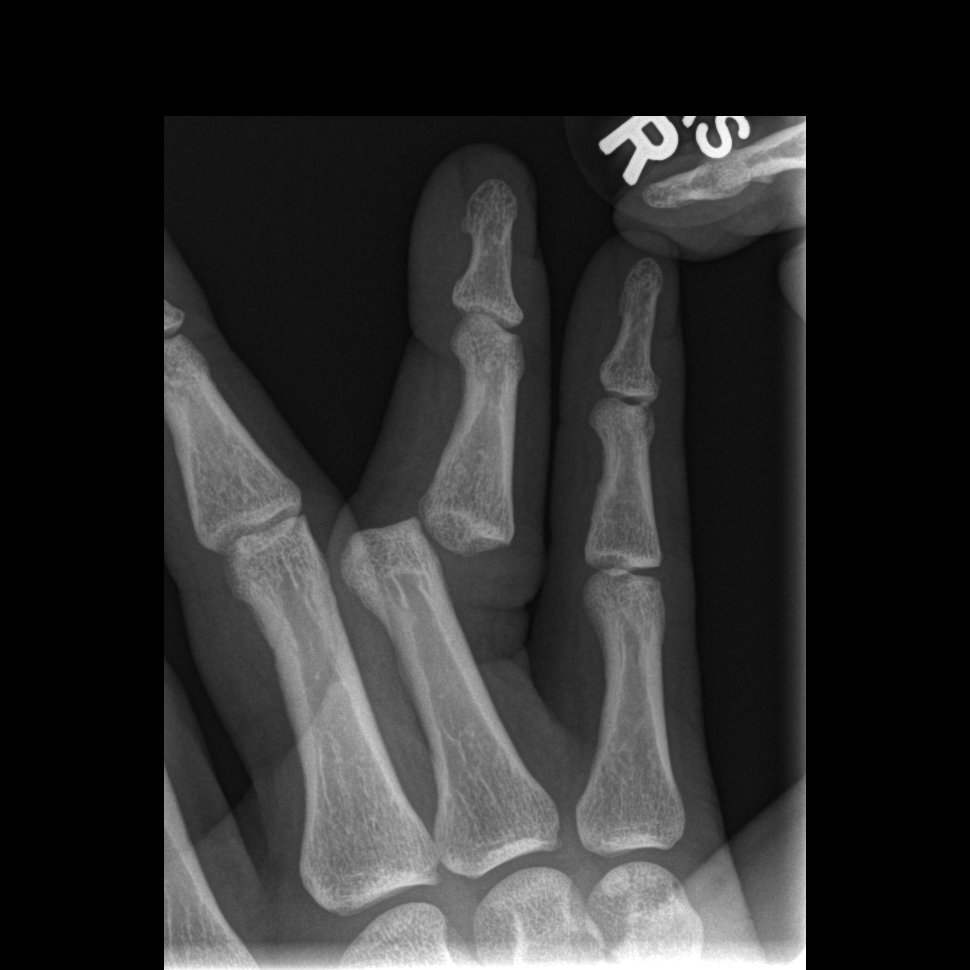

[x finger lateral right]
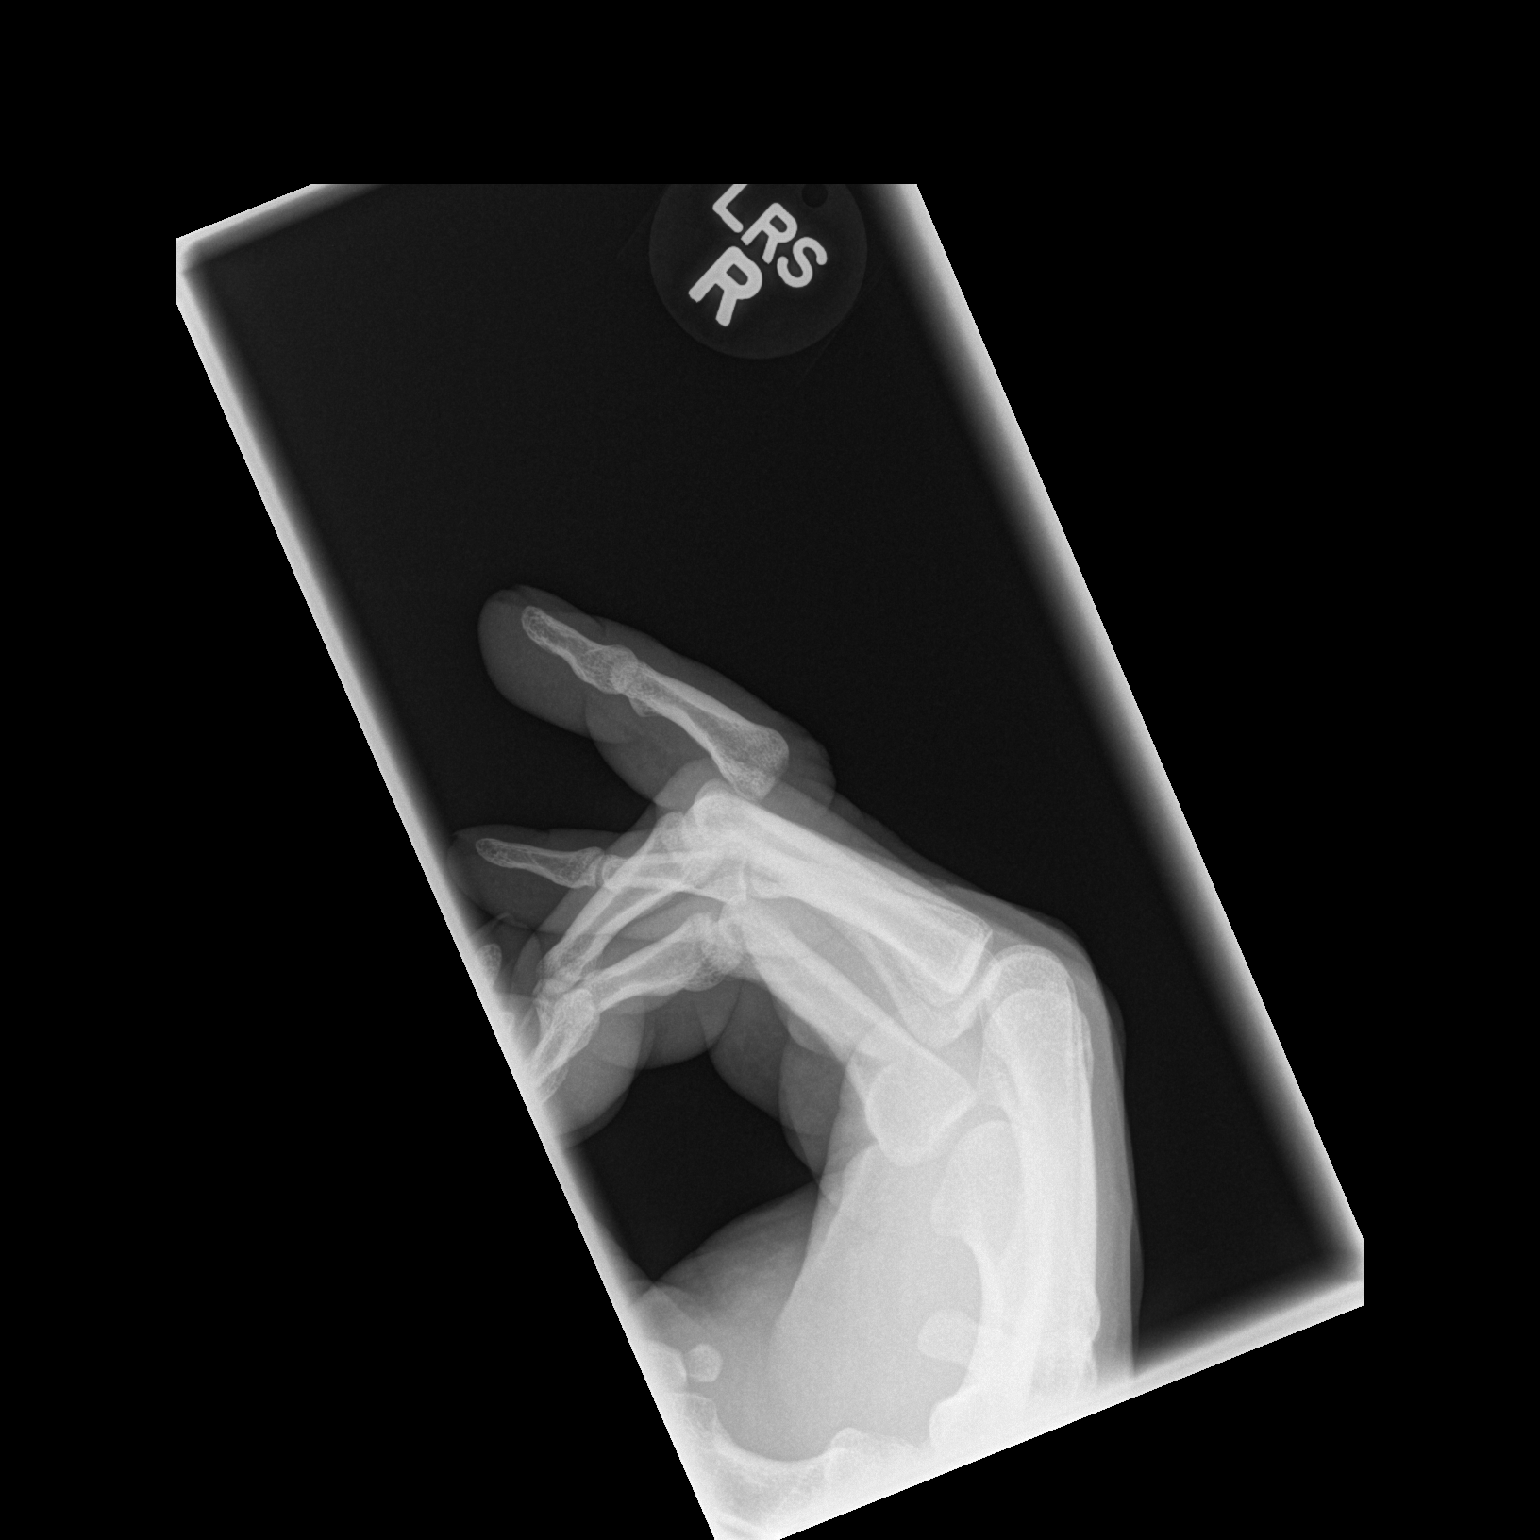

[3 of 3 positions shown; findings below may reference images not displayed]

FINDINGS: There is dorsal and ulnar dislocation and overriding of the middle
phalanx with respect the proximal phalanx of the right fourth
finger. No fractures are appreciated. Soft tissue swelling.
IMPRESSION: Dislocation of the proximal interphalangeal joint of the right
fourth finger.

## 2019-04-22 ENCOUNTER — Ambulatory Visit (LOCAL_COMMUNITY_HEALTH_CENTER): Payer: Self-pay

## 2019-04-22 ENCOUNTER — Other Ambulatory Visit: Payer: Self-pay | Admitting: Family Medicine

## 2019-04-22 ENCOUNTER — Other Ambulatory Visit: Payer: Self-pay

## 2019-04-22 DIAGNOSIS — Z202 Contact with and (suspected) exposure to infections with a predominantly sexual mode of transmission: Secondary | ICD-10-CM

## 2019-04-22 DIAGNOSIS — A64 Unspecified sexually transmitted disease: Secondary | ICD-10-CM

## 2019-04-22 LAB — GRAM STAIN

## 2019-04-22 MED ORDER — AZITHROMYCIN 500 MG PO TABS
1000.0000 mg | ORAL_TABLET | Freq: Once | ORAL | Status: AC
  Administered 2019-04-22: 1000 mg via ORAL

## 2019-04-22 NOTE — Progress Notes (Signed)
    STI clinic/screening visit  Subjective:  [name] is a 43 y.o. male being seen today for an STI screening visit. The patient reports they do not have symptoms.  Client states that he is contact to chlamydia  Patient has the following medical conditionsdoes not have a problem list on file.  No chief complaint on file.   Patient reports  HPI  Client states that his partner was treated for chlamydia on 04/18/2019.  He denies sympts.   See flowsheet for further details and programmatic requirements.    The following portions of the patient's history were reviewed and updated as appropriate: allergies, current medications, past family history, past medical history, past social history, past surgical history and problem list. Problem list updated.  Objective:  There were no vitals filed for this visit.  Physical Exam Constitutional:      Appearance: Normal appearance.  HENT:     Mouth/Throat:     Mouth: Mucous membranes are moist.     Pharynx: Oropharynx is clear. No oropharyngeal exudate or posterior oropharyngeal erythema.  Neck:     Musculoskeletal: Neck supple.  Genitourinary:    Penis: Normal.      Scrotum/Testes: Normal.  Lymphadenopathy:     Cervical: No cervical adenopathy.  Skin:    General: Skin is dry.     Findings: No lesion or rash.    Assessment and Plan:  [name] is a 43 y.o. male presenting to the [facility] for STI screening  There are no diagnoses linked to this encounter. STD Treat with Azithromycin 1000 mg po STAT Notify partners for treatment. Co. No sexual acitivity x 1 wk  Condoms. always   No follow-ups on file.  No future appointments.  Hassell Done, FNP

## 2019-04-22 NOTE — Progress Notes (Signed)
Gram stain reviewed, pt treated per provider order. Provider orders completed. 

## 2019-04-26 LAB — GONOCOCCUS CULTURE

## 2019-04-27 LAB — GONOCOCCUS CULTURE

## 2021-03-08 ENCOUNTER — Encounter (HOSPITAL_COMMUNITY): Payer: Self-pay

## 2021-03-08 ENCOUNTER — Other Ambulatory Visit: Payer: Self-pay

## 2021-03-08 ENCOUNTER — Ambulatory Visit (HOSPITAL_COMMUNITY)
Admission: EM | Admit: 2021-03-08 | Discharge: 2021-03-08 | Disposition: A | Discharge status: Home / Self Care | Payer: Self-pay | Attending: Internal Medicine | Admitting: Internal Medicine

## 2021-03-08 DIAGNOSIS — M542 Cervicalgia: Secondary | ICD-10-CM

## 2021-03-08 DIAGNOSIS — M5489 Other dorsalgia: Secondary | ICD-10-CM

## 2021-03-08 MED ORDER — TIZANIDINE HCL 4 MG PO TABS: 4.0000 mg | ORAL_TABLET | Freq: Every evening | ORAL | 0 refills | Status: AC | PRN

## 2021-03-08 MED ORDER — IBUPROFEN 600 MG PO TABS: 600.0000 mg | ORAL_TABLET | Freq: Four times a day (QID) | ORAL | 0 refills | Status: AC | PRN
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
## 2021-03-08 NOTE — Discharge Instructions (Addendum)
Take medications as directed Gentle range of motion exercises Heating pad is helpful If you experience any worsening headaches, persistent vomiting/nausea/blurry vision or confusion please return to the urgent care to be reevaluated.

## 2021-03-09 ENCOUNTER — Encounter (HOSPITAL_COMMUNITY): Payer: Self-pay | Admitting: Emergency Medicine

## 2021-03-09 ENCOUNTER — Emergency Department (HOSPITAL_COMMUNITY)
Admission: EM | Admit: 2021-03-09 | Discharge: 2021-03-09 | Disposition: A | Discharge status: Home / Self Care | Payer: Self-pay | Attending: Emergency Medicine | Admitting: Emergency Medicine

## 2021-03-09 ENCOUNTER — Encounter (HOSPITAL_COMMUNITY): Payer: Self-pay

## 2021-03-09 ENCOUNTER — Other Ambulatory Visit: Payer: Self-pay

## 2021-03-09 ENCOUNTER — Emergency Department (HOSPITAL_COMMUNITY)
Admission: EM | Admit: 2021-03-09 | Discharge: 2021-03-09 | Disposition: A | Discharge status: Home / Self Care | Payer: Self-pay | Source: Home / Self Care | Attending: Emergency Medicine | Admitting: Emergency Medicine

## 2021-03-09 DIAGNOSIS — Y92410 Unspecified street and highway as the place of occurrence of the external cause: Secondary | ICD-10-CM | POA: Insufficient documentation

## 2021-03-09 DIAGNOSIS — Z5321 Procedure and treatment not carried out due to patient leaving prior to being seen by health care provider: Secondary | ICD-10-CM | POA: Insufficient documentation

## 2021-03-09 DIAGNOSIS — S39012A Strain of muscle, fascia and tendon of lower back, initial encounter: Secondary | ICD-10-CM

## 2021-03-09 DIAGNOSIS — F17210 Nicotine dependence, cigarettes, uncomplicated: Secondary | ICD-10-CM | POA: Insufficient documentation

## 2021-03-09 DIAGNOSIS — M549 Dorsalgia, unspecified: Secondary | ICD-10-CM | POA: Insufficient documentation

## 2021-03-09 DIAGNOSIS — Y9389 Activity, other specified: Secondary | ICD-10-CM | POA: Insufficient documentation

## 2021-03-09 DIAGNOSIS — Y92481 Parking lot as the place of occurrence of the external cause: Secondary | ICD-10-CM | POA: Insufficient documentation

## 2021-03-09 NOTE — ED Notes (Signed)
An After Visit Summary was printed and given to the patient. Discharge instructions given and no further questions at this time.  

## 2021-03-09 NOTE — Discharge Instructions (Signed)
Take Tylenol or Motrin for pain.  You can go ahead and get the prescription for the muscle relaxer if needed you should be feeling much better in a few days.  Follow-up if any problem
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
## 2021-03-09 NOTE — ED Notes (Signed)
Patient called x 2 with no response. 
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
## 2021-12-14 ENCOUNTER — Emergency Department (HOSPITAL_COMMUNITY)
Admission: EM | Admit: 2021-12-14 | Discharge: 2021-12-14 | Disposition: A | Discharge status: Home / Self Care | Payer: Self-pay | Attending: Emergency Medicine | Admitting: Emergency Medicine

## 2021-12-14 ENCOUNTER — Other Ambulatory Visit: Payer: Self-pay

## 2021-12-14 ENCOUNTER — Encounter (HOSPITAL_COMMUNITY): Payer: Self-pay | Admitting: *Deleted

## 2021-12-14 DIAGNOSIS — R197 Diarrhea, unspecified: Secondary | ICD-10-CM | POA: Insufficient documentation

## 2021-12-14 LAB — URINALYSIS, ROUTINE W REFLEX MICROSCOPIC
Bilirubin Urine: NEGATIVE
Glucose, UA: NEGATIVE mg/dL
Hgb urine dipstick: NEGATIVE
Ketones, ur: NEGATIVE mg/dL
Leukocytes,Ua: NEGATIVE
Nitrite: NEGATIVE
Protein, ur: NEGATIVE mg/dL
Specific Gravity, Urine: 1.002 — ABNORMAL LOW (ref 1.005–1.030)
pH: 6 (ref 5.0–8.0)

## 2021-12-14 NOTE — ED Notes (Signed)
Pt again refused labs. States "I really don't like needles." Denies blood in stool.

## 2021-12-14 NOTE — Discharge Instructions (Signed)
Continue to drink plenty fluids to replace any fluid losses you have from the diarrhea.  Please return to the emergency department if you experience prolonged or worsening symptoms.

## 2021-12-14 NOTE — ED Triage Notes (Signed)
Diarrhea all day. No nausea or vomiting. Denies pain or fevers.

## 2021-12-14 NOTE — ED Notes (Signed)
Patient refused labs

## 2021-12-14 NOTE — ED Provider Notes (Signed)
[facility] DEPT Provider Note   CSN: [id number] Arrival date & time: 12/14/21  0516     History  Chief Complaint  Patient presents with   Diarrhea    [name] is a 46 y.o. male.   Diarrhea Diarrhea characteristics: [place]. Severity:  Moderate Onset quality:  Gradual Duration:  1 day Timing:  Intermittent Progression:  Improving Relieved by:  None tried Worsened by:  Nothing Ineffective treatments:  None tried Associated symptoms: no abdominal pain, no chills, no fever and no vomiting   Risk factors: no recent antibiotic use, no sick contacts and no travel to endemic areas       Home Medications Prior to Admission medications   Medication Sig Start Date End Date Taking? Authorizing Provider  ibuprofen (ADVIL) 600 MG tablet Take 1 tablet (600 mg total) by mouth every 6 (six) hours as needed. 03/08/21   Lamptey, Myrene Galas, MD  oxyCODONE-acetaminophen (PERCOCET) 5-325 MG per tablet Take 1 tablet by mouth every 6 (six) hours as needed. 04/24/14   Iran Planas, MD  tiZANidine (ZANAFLEX) 4 MG tablet Take 1 tablet (4 mg total) by mouth at bedtime as needed for muscle spasms. 03/08/21   Lamptey, Myrene Galas, MD      Allergies    Tylenol [acetaminophen]    Review of Systems   Review of Systems  Constitutional:  Negative for chills and fever.  Gastrointestinal:  Positive for diarrhea. Negative for abdominal pain and vomiting.  All other systems reviewed and are negative.  Physical Exam Updated Vital Signs BP (!) 145/93    Pulse 65    Temp 98.4 F (36.9 C) (Oral)    Resp 16    SpO2 100%  Physical Exam Vitals and nursing note reviewed.  Constitutional:      General: He is not in acute distress.    Appearance: Normal appearance. He is well-developed and normal weight. He is not ill-appearing, toxic-appearing or diaphoretic.  HENT:     Head: Normocephalic and atraumatic.     Right Ear: External ear normal.     Left Ear: External ear normal.      Nose: Nose normal.     Mouth/Throat:     Mouth: Mucous membranes are moist.     Pharynx: Oropharynx is clear.  Eyes:     General: No scleral icterus.    Conjunctiva/sclera: Conjunctivae normal.  Cardiovascular:     Rate and Rhythm: Normal rate and regular rhythm.     Heart sounds: No murmur heard. Pulmonary:     Effort: Pulmonary effort is normal. No respiratory distress.  Abdominal:     Palpations: Abdomen is soft.     Tenderness: There is no abdominal tenderness.  Musculoskeletal:        General: No swelling. Normal range of motion.     Cervical back: Neck supple.  Skin:    General: Skin is warm and dry.     Capillary Refill: Capillary refill takes less than 2 seconds.     Coloration: Skin is not jaundiced or pale.  Neurological:     General: No focal deficit present.     Mental Status: He is alert and oriented to person, place, and time.     Cranial Nerves: No cranial nerve deficit.     Sensory: No sensory deficit.     Motor: No weakness.     Coordination: Coordination normal.  Psychiatric:        Mood and Affect: Mood normal.  Behavior: Behavior normal.        Thought Content: Thought content normal.        Judgment: Judgment normal.    ED Results / Procedures / Treatments   Labs (all labs ordered are listed, but only abnormal results are displayed) Labs Reviewed  URINALYSIS, ROUTINE W REFLEX MICROSCOPIC - Abnormal; Notable for the following components:      Result Value   Color, Urine STRAW (*)    Specific Gravity, Urine 1.002 (*)    All other components within normal limits    EKG None  Radiology No results found.  Procedures Procedures    Medications Ordered in ED Medications - No data to display  ED Course/ Medical Decision Making/ A&P                           Medical Decision Making Amount and/or Complexity of Data Reviewed Labs: ordered.   Patient presents to the ED for diarrhea since yesterday.  Differential diagnosis includes  enteritis, colitis, foodborne illness, C. difficile, malabsorption.  He has no known significant medical history.  His symptoms have been limited to diarrhea only.  He denies any areas of pain.  He has not had any nausea, vomiting, difficulty with p.o. intake, fatigue, fevers, or chills.  Patient's diarrhea is described as brown in color and nonbloody.  Frequency of the diarrhea has improved since onset.  On arrival in the ED, his vital signs are reassuring.  He is well-appearing on exam.  His abdomen is soft without tenderness.  He declined lab work on arrival stating that he does not like needles.  Patient was again offered laboratory assessment which she declined.  Patient was encouraged to continue p.o. intake to replace fluid losses from diarrhea.  Given his well appearance and short timeline of symptoms, I suspect enteritis that should resolve within the next 24 hours.  Patient was encouraged to return to the emergency department at any time if he does have persistent symptoms or does change his mind about further work-up or IV hydration.  He is stable for discharge at this time.        Final Clinical Impression(s) / ED Diagnoses Final diagnoses:  Diarrhea, unspecified type    Rx / DC Orders ED Discharge Orders     None         Godfrey Pick, MD 12/15/21 [phone]
